# Patient Record
Sex: Male | Born: 1960 | Race: White | Hispanic: No | Marital: Married | Smoking: Current every day smoker
Health system: Southern US, Community
[De-identification: ages and names within clinical notes are randomized; demographics above are authoritative.]

## PROBLEM LIST (undated history)

## (undated) DIAGNOSIS — C4490 Unspecified malignant neoplasm of skin, unspecified: Secondary | ICD-10-CM

## (undated) DIAGNOSIS — M1990 Unspecified osteoarthritis, unspecified site: Secondary | ICD-10-CM

## (undated) DIAGNOSIS — J449 Chronic obstructive pulmonary disease, unspecified: Secondary | ICD-10-CM

## (undated) DIAGNOSIS — I739 Peripheral vascular disease, unspecified: Secondary | ICD-10-CM

## (undated) DIAGNOSIS — G4733 Obstructive sleep apnea (adult) (pediatric): Secondary | ICD-10-CM

## (undated) DIAGNOSIS — Z87442 Personal history of urinary calculi: Secondary | ICD-10-CM

## (undated) DIAGNOSIS — J189 Pneumonia, unspecified organism: Secondary | ICD-10-CM

## (undated) DIAGNOSIS — I10 Essential (primary) hypertension: Secondary | ICD-10-CM

## (undated) HISTORY — PX: COLONOSCOPY: SHX174

## (undated) HISTORY — PX: WISDOM TOOTH EXTRACTION: SHX21

## (undated) HISTORY — PX: VEIN SURGERY: SHX48

## (undated) HISTORY — PX: LUNG BIOPSY: SHX232

---

## 2019-01-31 DIAGNOSIS — I639 Cerebral infarction, unspecified: Secondary | ICD-10-CM

## 2019-01-31 HISTORY — DX: Cerebral infarction, unspecified: I63.9

## 2019-12-27 ENCOUNTER — Encounter (HOSPITAL_COMMUNITY): Payer: Self-pay

## 2019-12-27 NOTE — Progress Notes (Addendum)
COVID Vaccine Completed: No Date COVID Vaccine completed: N/A COVID vaccine manufacturer: N/A   surgical clearances  in chart  PCP - Etter Sjogren FNP Cardiologist - N/A Neurologist: Burna Sis FNP, [facility]  Chest x-ray - 10/25/19 in chart EKG -10/25/19 in chart Stress Test - greater than 2 years ECHO - request from [facility] Pacemaker/ICD device last checked:N/A  Sleep Study - Yes CPAP - Yes  Fasting Blood Sugar - N/A Checks Blood Sugar _N/A____ times a day  Blood Thinner Instructions: N/A Aspirin Instructions: Yes  Last Dose: 3-4 days prior to surgery  Anesthesia review: Stroke 2020, COPD  Patient denies shortness of breath, fever, cough and chest pain at PAT appointment   Patient verbalized understanding of instructions that were given to them at the PAT appointment. Patient was also instructed that they will need to review over the PAT instructions again at home before surgery.

## 2019-12-27 NOTE — Patient Instructions (Addendum)
DUE TO COVID-19 ONLY ONE VISITOR IS ALLOWED TO COME WITH YOU AND STAY IN THE WAITING ROOM ONLY DURING PRE OP AND PROCEDURE.    COVID SWAB TESTING MUST BE COMPLETED ON:  Today, Oct. 4, 2021 at 1:00 PM   [address] [place], [place] [address]  (Must self quarantine after testing. Follow instructions on handout.)       Your procedure is scheduled on:  Thursday, Oct. 7, 2021   Report to [facility] Main  Entrance    Report to admitting at 6:25 AM   Call this number if you have problems the morning of surgery [phone]   Do not eat food :After Midnight.   May have liquids until  5:45 AM  day of surgery  CLEAR LIQUID DIET  Foods Allowed                                                                     Foods Excluded  Water, Black Coffee and tea, regular and decaf                             liquids that you cannot  Plain Jell-O in any flavor  (No red)                                           see through such as: Fruit ices (not with fruit pulp)                                     milk, soups, orange juice              Iced Popsicles (No red)                                    All solid food                                   Apple juices Sports drinks like Gatorade (No red) Lightly seasoned clear broth or consume(fat free) Sugar, honey syrup  Sample Menu Breakfast                                Lunch                                     Supper Cranberry juice                    Beef broth                            Chicken broth Jell-O  Grape juice                           Apple juice Coffee or tea                        Jell-O                                      Popsicle                                                Coffee or tea                        Coffee or tea      Complete one Ensure drink the morning of surgery at  5:45AM     the day of surgery.   Oral Hygiene is also important to reduce your risk of infection.                                     Remember - BRUSH YOUR TEETH THE MORNING OF SURGERY WITH YOUR REGULAR TOOTHPASTE   Do NOT smoke after Midnight   Take these medicines the morning of surgery with A SIP OF WATER: None   Use Trelegy Inhaler the morning of surgery                               You may not have any metal on your body including  jewelry, and body piercings             Do not wear  lotions, powders, perfumes/cologne, or deodorant                         Men may shave face and neck.   Do not bring valuables to the hospital. [name].   Contacts, dentures or bridgework may not be worn into surgery.    Patients discharged the day of surgery will not be allowed to drive home.   Special Instructions: Bring a copy of your healthcare power of attorney and living will documents         the day of surgery if you haven't scanned them in before.              Please read over the following fact sheets you were given: IF YOU HAVE QUESTIONS ABOUT YOUR PRE OP INSTRUCTIONS PLEASE CALL [phone]   [facility] - Preparing for Surgery Before surgery, you can play an important role.  Because skin is not sterile, your skin needs to be as free of germs as possible.  You can reduce the number of germs on your skin by washing with CHG (chlorahexidine gluconate) soap before surgery.  CHG is an antiseptic cleaner which kills germs and bonds with the skin to continue killing germs even after washing. Please DO NOT use if you have an allergy to CHG or antibacterial  soaps.  If your skin becomes reddened/irritated stop using the CHG and inform your nurse when you arrive at Short Stay. Do not shave (including legs and underarms) for at least 48 hours prior to the first CHG shower.  You may shave your face/neck.  Please follow these instructions carefully:  1.  Shower with CHG Soap the night before surgery and the  morning of surgery.  2.  If you choose to wash  your hair, wash your hair first as usual with your normal  shampoo.  3.  After you shampoo, rinse your hair and body thoroughly to remove the shampoo.                             4.  Use CHG as you would any other liquid soap.  You can apply chg directly to the skin and wash.  Gently with a scrungie or clean washcloth.  5.  Apply the CHG Soap to your body ONLY FROM THE NECK DOWN.   Do   not use on face/ open                           Wound or open sores. Avoid contact with eyes, ears mouth and   genitals (private parts).                       Wash face,  Genitals (private parts) with your normal soap.             6.  Wash thoroughly, paying special attention to the area where your    surgery  will be performed.  7.  Thoroughly rinse your body with warm water from the neck down.  8.  DO NOT shower/wash with your normal soap after using and rinsing off the CHG Soap.                9.  Pat yourself dry with a clean towel.            10.  Wear clean pajamas.            11.  Place clean sheets on your bed the night of your first shower and do not  sleep with pets. Day of Surgery : Do not apply any lotions/deodorants the morning of surgery.  Please wear clean clothes to the hospital/surgery center.  FAILURE TO FOLLOW THESE INSTRUCTIONS MAY RESULT IN THE CANCELLATION OF YOUR SURGERY  PATIENT SIGNATURE_________________________________  NURSE SIGNATURE__________________________________  ________________________________________________________________________    Greg Morris  An incentive spirometer is a tool that can help keep your lungs clear and active. This tool measures how well you are filling your lungs with each breath. Taking long deep breaths may help reverse or decrease the chance of developing breathing (pulmonary) problems (especially infection) following:  A long period of time when you are unable to move or be active. BEFORE THE PROCEDURE   If the spirometer includes an  indicator to show your best effort, your nurse or respiratory therapist will set it to a desired goal.  If possible, sit up straight or lean slightly forward. Try not to slouch.  Hold the incentive spirometer in an upright position. INSTRUCTIONS FOR USE  1. Sit on the edge of your bed if possible, or sit up as far as you can in bed or on a chair. 2. Hold the incentive spirometer in an upright position.  3. Breathe out normally. 4. Place the mouthpiece in your mouth and seal your lips tightly around it. 5. Breathe in slowly and as deeply as possible, raising the piston or the ball toward the top of the column. 6. Hold your breath for 3-5 seconds or for as long as possible. Allow the piston or ball to fall to the bottom of the column. 7. Remove the mouthpiece from your mouth and breathe out normally. 8. Rest for a few seconds and repeat Steps 1 through 7 at least 10 times every 1-2 hours when you are awake. Take your time and take a few normal breaths between deep breaths. 9. The spirometer may include an indicator to show your best effort. Use the indicator as a goal to work toward during each repetition. 10. After each set of 10 deep breaths, practice coughing to be sure your lungs are clear. If you have an incision (the cut made at the time of surgery), support your incision when coughing by placing a pillow or rolled up towels firmly against it. Once you are able to get out of bed, walk around indoors and cough well. You may stop using the incentive spirometer when instructed by your caregiver.  RISKS AND COMPLICATIONS  Take your time so you do not get dizzy or light-headed.  If you are in pain, you may need to take or ask for pain medication before doing incentive spirometry. It is harder to take a deep breath if you are having pain. AFTER USE  Rest and breathe slowly and easily.  It can be helpful to keep track of a log of your progress. Your caregiver can provide you with a simple table  to help with this. If you are using the spirometer at home, follow these instructions: [place] IF:   You are having difficultly using the spirometer.  You have trouble using the spirometer as often as instructed.  Your pain medication is not giving enough relief while using the spirometer.  You develop fever of 100.5 F (38.1 C) or higher. SEEK IMMEDIATE MEDICAL CARE IF:   You cough up bloody sputum that had not been present before.  You develop fever of 102 F (38.9 C) or greater.  You develop worsening pain at or near the incision site. MAKE SURE YOU:   Understand these instructions.  Will watch your condition.  Will get help right away if you are not doing well or get worse. Document Released: 07/25/2006 Document Revised: 06/06/2011 Document Reviewed: 09/25/2006 Exit[employer] Patient Information 2014 [employer], [place].   ________________________________________________________________________  WHAT IS A BLOOD TRANSFUSION? Blood Transfusion Information  A transfusion is the replacement of blood or some of its parts. Blood is made up of multiple cells which provide different functions.  Red blood cells carry oxygen and are used for blood loss replacement.  White blood cells fight against infection.  Platelets control bleeding.  Plasma helps clot blood.  Other blood products are available for specialized needs, such as hemophilia or other clotting disorders. BEFORE THE TRANSFUSION  Who gives blood for transfusions?   Healthy volunteers who are fully evaluated to make sure their blood is safe. This is blood bank blood. Transfusion therapy is the safest it has ever been in the practice of medicine. Before blood is taken from a donor, a complete history is taken to make sure that person has no history of diseases nor engages in risky social behavior (examples are intravenous drug use or sexual activity with multiple partners). The  donor's travel history is screened  to minimize risk of transmitting infections, such as malaria. The donated blood is tested for signs of infectious diseases, such as HIV and hepatitis. The blood is then tested to be sure it is compatible with you in order to minimize the chance of a transfusion reaction. If you or a relative donates blood, this is often done in anticipation of surgery and is not appropriate for emergency situations. It takes many days to process the donated blood. RISKS AND COMPLICATIONS Although transfusion therapy is very safe and saves many lives, the main dangers of transfusion include:   Getting an infectious disease.  Developing a transfusion reaction. This is an allergic reaction to something in the blood you were given. Every precaution is taken to prevent this. The decision to have a blood transfusion has been considered carefully by your caregiver before blood is given. Blood is not given unless the benefits outweigh the risks. AFTER THE TRANSFUSION  Right after receiving a blood transfusion, you will usually feel much better and more energetic. This is especially true if your red blood cells have gotten low (anemic). The transfusion raises the level of the red blood cells which carry oxygen, and this usually causes an energy increase.  The nurse administering the transfusion will monitor you carefully for complications. HOME CARE INSTRUCTIONS  No special instructions are needed after a transfusion. You may find your energy is better. Speak with your caregiver about any limitations on activity for underlying diseases you may have. SEEK MEDICAL CARE IF:   Your condition is not improving after your transfusion.  You develop redness or irritation at the intravenous (IV) site. SEEK IMMEDIATE MEDICAL CARE IF:  Any of the following symptoms occur over the next 12 hours:  Shaking chills.  You have a temperature by mouth above 102 F (38.9 C), not controlled by medicine.  Chest, back, or muscle  pain.  People around you feel you are not acting correctly or are confused.  Shortness of breath or difficulty breathing.  Dizziness and fainting.  You get a rash or develop hives.  You have a decrease in urine output.  Your urine turns a dark color or changes to pink, red, or brown. Any of the following symptoms occur over the next 10 days:  You have a temperature by mouth above 102 F (38.9 C), not controlled by medicine.  Shortness of breath.  Weakness after normal activity.  The white part of the eye turns yellow (jaundice).  You have a decrease in the amount of urine or are urinating less often.  Your urine turns a dark color or changes to pink, red, or brown. Document Released: 03/11/2000 Document Revised: 06/06/2011 Document Reviewed: 10/29/2007 [facility] Patient Information 2014 [place], [place].  _______________________________________________________________________

## 2019-12-30 ENCOUNTER — Encounter (HOSPITAL_COMMUNITY)
Admission: RE | Admit: 2019-12-30 | Discharge: 2019-12-30 | Disposition: A | Discharge status: Home / Self Care | Payer: BC Managed Care – PPO | Source: Ambulatory Visit | Attending: Orthopedic Surgery | Admitting: Orthopedic Surgery

## 2019-12-30 ENCOUNTER — Other Ambulatory Visit (HOSPITAL_COMMUNITY)
Admission: RE | Admit: 2019-12-30 | Discharge: 2019-12-30 | Disposition: A | Discharge status: Home / Self Care | Payer: BC Managed Care – PPO | Source: Ambulatory Visit | Attending: Orthopedic Surgery | Admitting: Orthopedic Surgery

## 2019-12-30 ENCOUNTER — Encounter (HOSPITAL_COMMUNITY): Payer: Self-pay

## 2019-12-30 ENCOUNTER — Other Ambulatory Visit: Payer: Self-pay

## 2019-12-30 DIAGNOSIS — Z01812 Encounter for preprocedural laboratory examination: Secondary | ICD-10-CM | POA: Insufficient documentation

## 2019-12-30 DIAGNOSIS — Z20822 Contact with and (suspected) exposure to covid-19: Secondary | ICD-10-CM | POA: Insufficient documentation

## 2019-12-30 HISTORY — DX: Obstructive sleep apnea (adult) (pediatric): G47.33

## 2019-12-30 HISTORY — DX: Essential (primary) hypertension: I10

## 2019-12-30 HISTORY — DX: Unspecified malignant neoplasm of skin, unspecified: C44.90

## 2019-12-30 HISTORY — DX: Personal history of urinary calculi: Z87.442

## 2019-12-30 HISTORY — DX: Pneumonia, unspecified organism: J18.9

## 2019-12-30 HISTORY — DX: Chronic obstructive pulmonary disease, unspecified: J44.9

## 2019-12-30 HISTORY — DX: Unspecified osteoarthritis, unspecified site: M19.90

## 2019-12-30 HISTORY — DX: Peripheral vascular disease, unspecified: I73.9

## 2019-12-30 LAB — CBC
HCT: 45 % (ref 39.0–52.0)
Hemoglobin: 15 g/dL (ref 13.0–17.0)
MCH: 32.3 pg (ref 26.0–34.0)
MCHC: 33.3 g/dL (ref 30.0–36.0)
MCV: 97 fL (ref 80.0–100.0)
Platelets: 308 10*3/uL (ref 150–400)
RBC: 4.64 MIL/uL (ref 4.22–5.81)
RDW: 12.4 % (ref 11.5–15.5)
WBC: 8.3 10*3/uL (ref 4.0–10.5)
nRBC: 0 % (ref 0.0–0.2)

## 2019-12-30 LAB — BASIC METABOLIC PANEL
Anion gap: 11 (ref 5–15)
BUN: 11 mg/dL (ref 6–20)
CO2: 23 mmol/L (ref 22–32)
Calcium: 8.9 mg/dL (ref 8.9–10.3)
Chloride: 106 mmol/L (ref 98–111)
Creatinine, Ser: 1.05 mg/dL (ref 0.61–1.24)
GFR calc Af Amer: >60 mL/min (ref >60)
GFR calc non Af Amer: >60 mL/min (ref >60)
Glucose, Bld: 99 mg/dL (ref 70–99)
Potassium: 3.8 mmol/L (ref 3.5–5.1)
Sodium: 140 mmol/L (ref 135–145)

## 2019-12-30 LAB — SURGICAL PCR SCREEN
MRSA, PCR: NEGATIVE
Staphylococcus aureus: NEGATIVE

## 2019-12-30 LAB — SARS CORONAVIRUS 2 (TAT 6-24 HRS): SARS Coronavirus 2: NEGATIVE

## 2020-01-02 ENCOUNTER — Encounter (HOSPITAL_COMMUNITY): Payer: Self-pay | Admitting: Orthopedic Surgery

## 2020-01-02 ENCOUNTER — Encounter (HOSPITAL_COMMUNITY)
Admission: RE | Disposition: A | Discharge status: Home / Self Care | Payer: Self-pay | Source: Home / Self Care | Attending: Orthopedic Surgery

## 2020-01-02 ENCOUNTER — Ambulatory Visit (HOSPITAL_COMMUNITY): Payer: BC Managed Care – PPO

## 2020-01-02 ENCOUNTER — Ambulatory Visit (HOSPITAL_COMMUNITY): Payer: BC Managed Care – PPO | Admitting: Physician Assistant

## 2020-01-02 ENCOUNTER — Ambulatory Visit (HOSPITAL_COMMUNITY)
Admission: RE | Admit: 2020-01-02 | Discharge: 2020-01-02 | Disposition: A | Discharge status: Home / Self Care | Payer: BC Managed Care – PPO | Attending: Orthopedic Surgery | Admitting: Orthopedic Surgery

## 2020-01-02 ENCOUNTER — Ambulatory Visit (HOSPITAL_COMMUNITY): Payer: BC Managed Care – PPO | Admitting: Certified Registered Nurse Anesthetist

## 2020-01-02 DIAGNOSIS — M1611 Unilateral primary osteoarthritis, right hip: Secondary | ICD-10-CM | POA: Insufficient documentation

## 2020-01-02 DIAGNOSIS — G4730 Sleep apnea, unspecified: Secondary | ICD-10-CM | POA: Diagnosis not present

## 2020-01-02 DIAGNOSIS — I739 Peripheral vascular disease, unspecified: Secondary | ICD-10-CM | POA: Insufficient documentation

## 2020-01-02 DIAGNOSIS — F17200 Nicotine dependence, unspecified, uncomplicated: Secondary | ICD-10-CM | POA: Insufficient documentation

## 2020-01-02 DIAGNOSIS — Z85828 Personal history of other malignant neoplasm of skin: Secondary | ICD-10-CM | POA: Diagnosis not present

## 2020-01-02 DIAGNOSIS — I69354 Hemiplegia and hemiparesis following cerebral infarction affecting left non-dominant side: Secondary | ICD-10-CM | POA: Diagnosis not present

## 2020-01-02 DIAGNOSIS — M879 Osteonecrosis, unspecified: Secondary | ICD-10-CM | POA: Insufficient documentation

## 2020-01-02 DIAGNOSIS — J449 Chronic obstructive pulmonary disease, unspecified: Secondary | ICD-10-CM | POA: Diagnosis not present

## 2020-01-02 DIAGNOSIS — Z87442 Personal history of urinary calculi: Secondary | ICD-10-CM | POA: Insufficient documentation

## 2020-01-02 DIAGNOSIS — F17210 Nicotine dependence, cigarettes, uncomplicated: Secondary | ICD-10-CM | POA: Diagnosis not present

## 2020-01-02 DIAGNOSIS — S72041A Displaced fracture of base of neck of right femur, initial encounter for closed fracture: Secondary | ICD-10-CM

## 2020-01-02 DIAGNOSIS — G4733 Obstructive sleep apnea (adult) (pediatric): Secondary | ICD-10-CM | POA: Insufficient documentation

## 2020-01-02 DIAGNOSIS — Z96649 Presence of unspecified artificial hip joint: Secondary | ICD-10-CM

## 2020-01-02 DIAGNOSIS — I10 Essential (primary) hypertension: Secondary | ICD-10-CM | POA: Diagnosis not present

## 2020-01-02 HISTORY — PX: TOTAL HIP ARTHROPLASTY: SHX124

## 2020-01-02 LAB — TYPE AND SCREEN
ABO/RH(D): O POS
Antibody Screen: NEGATIVE

## 2020-01-02 LAB — ABO/RH: ABO/RH(D): O POS

## 2020-01-02 SURGERY — ARTHROPLASTY, HIP, TOTAL, ANTERIOR APPROACH
Anesthesia: Spinal | Site: Hip | Laterality: Right

## 2020-01-02 MED ORDER — CHLORHEXIDINE GLUCONATE 0.12 % MT SOLN
15.0000 mL | Freq: Once | OROMUCOSAL | Status: AC
  Administered 2020-01-02: 15 mL via OROMUCOSAL

## 2020-01-02 MED ORDER — CEFAZOLIN SODIUM-DEXTROSE 2-4 GM/100ML-% IV SOLN
2.0000 g | INTRAVENOUS | Status: AC
  Administered 2020-01-02: 2 g via INTRAVENOUS

## 2020-01-02 MED ORDER — CEFAZOLIN SODIUM-DEXTROSE 2-4 GM/100ML-% IV SOLN
2.0000 g | Freq: Four times a day (QID) | INTRAVENOUS | Status: DC
  Administered 2020-01-02: 2 g via INTRAVENOUS

## 2020-01-02 MED ORDER — CEFAZOLIN SODIUM-DEXTROSE 2-4 GM/100ML-% IV SOLN
INTRAVENOUS | Status: AC
  Filled 2020-01-02: qty 100

## 2020-01-02 MED ORDER — ONDANSETRON HCL 4 MG/2ML IJ SOLN
INTRAMUSCULAR | Status: AC
  Filled 2020-01-02: qty 2

## 2020-01-02 MED ORDER — PROPOFOL 500 MG/50ML IV EMUL
INTRAVENOUS | Status: AC
  Filled 2020-01-02: qty 50

## 2020-01-02 MED ORDER — OXYCODONE HCL 5 MG PO TABS
5.0000 mg | ORAL_TABLET | Freq: Once | ORAL | Status: AC | PRN
  Administered 2020-01-02: 5 mg via ORAL

## 2020-01-02 MED ORDER — FENTANYL CITRATE (PF) 100 MCG/2ML IJ SOLN: 25.0000 ug | INTRAMUSCULAR | Status: DC | PRN

## 2020-01-02 MED ORDER — LACTATED RINGERS IV BOLUS
250.0000 mL | Freq: Once | INTRAVENOUS | Status: AC
  Administered 2020-01-02: 250 mL via INTRAVENOUS

## 2020-01-02 MED ORDER — TRANEXAMIC ACID-NACL 1000-0.7 MG/100ML-% IV SOLN
1000.0000 mg | INTRAVENOUS | Status: AC
  Administered 2020-01-02: 1000 mg via INTRAVENOUS

## 2020-01-02 MED ORDER — TRANEXAMIC ACID-NACL 1000-0.7 MG/100ML-% IV SOLN
1000.0000 mg | Freq: Once | INTRAVENOUS | Status: AC
  Administered 2020-01-02: 1000 mg via INTRAVENOUS

## 2020-01-02 MED ORDER — SODIUM CHLORIDE 0.9 % IR SOLN
Status: DC | PRN
  Administered 2020-01-02: 1000 mL

## 2020-01-02 MED ORDER — DEXAMETHASONE SODIUM PHOSPHATE 10 MG/ML IJ SOLN
INTRAMUSCULAR | Status: AC
  Filled 2020-01-02: qty 1

## 2020-01-02 MED ORDER — ONDANSETRON HCL 4 MG/2ML IJ SOLN
INTRAMUSCULAR | Status: DC | PRN
  Administered 2020-01-02: 4 mg via INTRAVENOUS

## 2020-01-02 MED ORDER — PROPOFOL 10 MG/ML IV BOLUS
INTRAVENOUS | Status: AC
  Filled 2020-01-02: qty 20

## 2020-01-02 MED ORDER — DEXAMETHASONE SODIUM PHOSPHATE 10 MG/ML IJ SOLN
10.0000 mg | Freq: Once | INTRAMUSCULAR | Status: AC
  Administered 2020-01-02: 10 mg via INTRAVENOUS

## 2020-01-02 MED ORDER — LIDOCAINE 2% (20 MG/ML) 5 ML SYRINGE
INTRAMUSCULAR | Status: AC
  Filled 2020-01-02: qty 5

## 2020-01-02 MED ORDER — ORAL CARE MOUTH RINSE: 15.0000 mL | Freq: Once | OROMUCOSAL | Status: AC

## 2020-01-02 MED ORDER — OXYCODONE HCL 5 MG/5ML PO SOLN: 5.0000 mg | Freq: Once | ORAL | Status: AC | PRN

## 2020-01-02 MED ORDER — PROPOFOL 10 MG/ML IV BOLUS
INTRAVENOUS | Status: DC | PRN
  Administered 2020-01-02 (×2): 30 mg via INTRAVENOUS
  Administered 2020-01-02: 20 mg via INTRAVENOUS
  Administered 2020-01-02: 30 mg via INTRAVENOUS
  Administered 2020-01-02 (×4): 20 mg via INTRAVENOUS
  Administered 2020-01-02: 40 mg via INTRAVENOUS
  Administered 2020-01-02: 20 mg via INTRAVENOUS

## 2020-01-02 MED ORDER — LACTATED RINGERS IV SOLN: INTRAVENOUS | Status: DC

## 2020-01-02 MED ORDER — PHENYLEPHRINE HCL-NACL 10-0.9 MG/250ML-% IV SOLN
INTRAVENOUS | Status: DC | PRN
  Administered 2020-01-02: 20 ug/min via INTRAVENOUS

## 2020-01-02 MED ORDER — PHENYLEPHRINE HCL (PRESSORS) 10 MG/ML IV SOLN
INTRAVENOUS | Status: AC
  Filled 2020-01-02: qty 1

## 2020-01-02 MED ORDER — MIDAZOLAM HCL 5 MG/5ML IJ SOLN
INTRAMUSCULAR | Status: DC | PRN
  Administered 2020-01-02: 2 mg via INTRAVENOUS

## 2020-01-02 MED ORDER — LIDOCAINE 2% (20 MG/ML) 5 ML SYRINGE
INTRAMUSCULAR | Status: DC | PRN
  Administered 2020-01-02: 50 mg via INTRAVENOUS

## 2020-01-02 MED ORDER — STERILE WATER FOR IRRIGATION IR SOLN
Status: DC | PRN
Start: 2020-01-02 — End: 2020-01-02
  Administered 2020-01-02: 2000 mL

## 2020-01-02 MED ORDER — LACTATED RINGERS IV BOLUS
500.0000 mL | Freq: Once | INTRAVENOUS | Status: AC
  Administered 2020-01-02: 500 mL via INTRAVENOUS

## 2020-01-02 MED ORDER — TRANEXAMIC ACID-NACL 1000-0.7 MG/100ML-% IV SOLN
INTRAVENOUS | Status: AC
  Filled 2020-01-02: qty 100

## 2020-01-02 MED ORDER — FENTANYL CITRATE (PF) 100 MCG/2ML IJ SOLN
INTRAMUSCULAR | Status: AC
  Filled 2020-01-02: qty 2

## 2020-01-02 MED ORDER — FENTANYL CITRATE (PF) 100 MCG/2ML IJ SOLN
INTRAMUSCULAR | Status: DC | PRN
Start: 2020-01-02 — End: 2020-01-02

## 2020-01-02 MED ORDER — OXYCODONE HCL 5 MG PO TABS
5.0000 mg | ORAL_TABLET | Freq: Once | ORAL | Status: AC
  Administered 2020-01-02: 5 mg via ORAL

## 2020-01-02 MED ORDER — PROPOFOL 500 MG/50ML IV EMUL
INTRAVENOUS | Status: DC | PRN
  Administered 2020-01-02: 100 ug/kg/min via INTRAVENOUS

## 2020-01-02 MED ORDER — OXYCODONE HCL 5 MG PO TABS
ORAL_TABLET | ORAL | Status: AC
  Filled 2020-01-02: qty 1

## 2020-01-02 MED ORDER — PROMETHAZINE HCL 25 MG/ML IJ SOLN: 6.2500 mg | INTRAMUSCULAR | Status: DC | PRN

## 2020-01-02 MED ORDER — BUPIVACAINE IN DEXTROSE 0.75-8.25 % IT SOLN
INTRATHECAL | Status: DC | PRN
  Administered 2020-01-02: 1.8 mL via INTRATHECAL

## 2020-01-02 MED ORDER — MIDAZOLAM HCL 2 MG/2ML IJ SOLN
INTRAMUSCULAR | Status: AC
  Filled 2020-01-02: qty 2

## 2020-01-02 SURGICAL SUPPLY — 47 items
ADH SKN CLS APL DERMABOND .7 (GAUZE/BANDAGES/DRESSINGS) ×1
BAG DECANTER FOR FLEXI CONT (MISCELLANEOUS) IMPLANT
BAG SPEC THK2 15X12 ZIP CLS (MISCELLANEOUS)
BAG ZIPLOCK 12X15 (MISCELLANEOUS) IMPLANT
BLADE SAG 18X100X1.27 (BLADE) ×3 IMPLANT
BLADE SURG SZ10 CARB STEEL (BLADE) ×6 IMPLANT
COVER PERINEAL POST (MISCELLANEOUS) ×3 IMPLANT
COVER SURGICAL LIGHT HANDLE (MISCELLANEOUS) ×3 IMPLANT
COVER WAND RF STERILE (DRAPES) IMPLANT
CUP ACETBLR 54 OD PINNACLE (Hips) ×3 IMPLANT
DERMABOND ADVANCED (GAUZE/BANDAGES/DRESSINGS) ×2
DERMABOND ADVANCED .7 DNX12 (GAUZE/BANDAGES/DRESSINGS) ×1 IMPLANT
DRAPE STERI IOBAN 125X83 (DRAPES) ×3 IMPLANT
DRAPE U-SHAPE 47X51 STRL (DRAPES) ×6 IMPLANT
DRESSING AQUACEL AG SP 3.5X10 (GAUZE/BANDAGES/DRESSINGS) ×1 IMPLANT
DRSG AQUACEL AG ADV 3.5X10 (GAUZE/BANDAGES/DRESSINGS) ×3 IMPLANT
DRSG AQUACEL AG SP 3.5X10 (GAUZE/BANDAGES/DRESSINGS) ×3
DURAPREP 26ML APPLICATOR (WOUND CARE) ×3 IMPLANT
ELECT REM PT RETURN 15FT ADLT (MISCELLANEOUS) ×3 IMPLANT
ELIMINATOR HOLE APEX DEPUY (Hips) ×3 IMPLANT
GLOVE BIO SURGEON STRL SZ 6 (GLOVE) ×6 IMPLANT
GLOVE BIOGEL PI IND STRL 6.5 (GLOVE) ×1 IMPLANT
GLOVE BIOGEL PI IND STRL 7.5 (GLOVE) ×1 IMPLANT
GLOVE BIOGEL PI IND STRL 8.5 (GLOVE) ×1 IMPLANT
GLOVE BIOGEL PI INDICATOR 6.5 (GLOVE) ×2
GLOVE BIOGEL PI INDICATOR 7.5 (GLOVE) ×2
GLOVE BIOGEL PI INDICATOR 8.5 (GLOVE) ×2
GLOVE ECLIPSE 8.0 STRL XLNG CF (GLOVE) ×6 IMPLANT
GLOVE ORTHO TXT STRL SZ7.5 (GLOVE) ×6 IMPLANT
GOWN STRL REUS W/TWL LRG LVL3 (GOWN DISPOSABLE) ×6 IMPLANT
GOWN STRL REUS W/TWL XL LVL3 (GOWN DISPOSABLE) ×3 IMPLANT
HEAD CERAMIC 36 PLUS5 (Hips) ×3 IMPLANT
HOLDER FOLEY CATH W/STRAP (MISCELLANEOUS) ×3 IMPLANT
KIT TURNOVER KIT A (KITS) IMPLANT
LINER NEUTRAL 54X36MM PLUS 4 (Hips) ×3 IMPLANT
PACK ANTERIOR HIP CUSTOM (KITS) ×3 IMPLANT
PENCIL SMOKE EVACUATOR (MISCELLANEOUS) IMPLANT
SCREW 6.5MMX30MM (Screw) ×3 IMPLANT
STEM FEM ACTIS HIGH SZ8 (Stem) ×3 IMPLANT
SUT MNCRL AB 4-0 PS2 18 (SUTURE) ×3 IMPLANT
SUT STRATAFIX 0 PDS 27 VIOLET (SUTURE) ×3
SUT VIC AB 1 CT1 36 (SUTURE) ×9 IMPLANT
SUT VIC AB 2-0 CT1 27 (SUTURE) ×6
SUT VIC AB 2-0 CT1 TAPERPNT 27 (SUTURE) ×2 IMPLANT
SUTURE STRATFX 0 PDS 27 VIOLET (SUTURE) ×1 IMPLANT
TRAY FOLEY MTR SLVR 16FR STAT (SET/KITS/TRAYS/PACK) IMPLANT
WATER STERILE IRR 1000ML POUR (IV SOLUTION) ×3 IMPLANT

## 2020-01-02 NOTE — Discharge Instructions (Signed)

## 2020-01-02 NOTE — H&P (Signed)
TOTAL HIP ADMISSION H&P  Patient is admitted for right total hip arthroplasty.  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 male, has a history of pain and functional disability in the right hip(s) due to arthritis and AVN and patient has failed non-surgical conservative treatments for greater than 12 weeks to include NSAID's and/or analgesics and activity modification.  Onset of symptoms was gradual starting 7-8 years ago with gradually worsening course since that time.The patient noted no past surgery on the right hip(s).  Patient currently rates pain in the right hip at 10 out of 10 with activity. Patient has night pain, worsening of pain with activity and weight bearing, trendelenberg gait, pain that interfers with activities of daily living and pain with passive range of motion. Patient has evidence of periarticular osteophytes, joint space narrowing and AVN by imaging studies. This condition presents safety issues increasing the risk of falls.   There is no current active infection.  Risks, benefits and expectations were discussed with the patient.  Risks including but not limited to the risk of anesthesia, blood clots, nerve damage, blood vessel damage, failure of the prosthesis, infection and up to and including death.  Patient understand the risks, benefits and expectations and wishes to proceed with surgery.   PCP: Etter Sjogren, FNP  D/C Plans:       Home (SD)  Post-op Meds:        Rx given for ASA, Robaxin, Norco, Iron, Colace and MiraLax  Tranexamic Acid:      To be given - IV   Decadron:      Is to be given  FYI:      ASA  Norco  DME:   Pt already has equipment   PT:   [facility]  Pharmacy: [facility], [place]    Past Medical History:  Diagnosis Date  . Arthritis   . COPD (chronic obstructive pulmonary disease) ([place])   . History of kidney stones   . Hypertension   . OSA on CPAP   . Pneumonia    history of 15 years ago  .  PVD (peripheral vascular disease) ([facility])    leaking valves in legs  . Skin cancer   . Stroke ([place]) 01/31/2019   left side weakness and tingling    Past Surgical History:  Procedure Laterality Date  . COLONOSCOPY    . LUNG BIOPSY    . VEIN SURGERY Bilateral   . WISDOM TOOTH EXTRACTION      Current Facility-Administered Medications  Medication Dose Route Frequency Provider Last Rate Last Admin  . ceFAZolin (ANCEF) 2-4 GM/100ML-% IVPB           . ceFAZolin (ANCEF) IVPB 2g/100 mL premix  2 g Intravenous On Call to OR Tenzin Pavon, Rodman Key, PA-C      . dexamethasone (DECADRON) injection 10 mg  10 mg Intravenous Once Kalie Cabral, PA-C      . lactated ringers infusion   Intravenous Continuous Lyn Hollingshead, MD 10 mL/hr at 01/02/20 0655 New Bag at 01/02/20 8469  . tranexamic acid (CYKLOKAPRON) 1000MG /138mL IVPB           . tranexamic acid (CYKLOKAPRON) IVPB 1,000 mg  1,000 mg Intravenous To OR Avan Gullett, PA-C       No Known Allergies  Social History   Tobacco Use  . Smoking status: Current Every Day Smoker    Packs/day: 1.00    Years: 39.00    Pack years:  39.00    Types: Cigarettes  . Smokeless tobacco: Never Used  Substance Use Topics  . Alcohol use: Yes    Comment: rare    History reviewed. No pertinent family history.   Review of Systems  Constitutional: Negative.   HENT: Negative.   Eyes: Negative.   Respiratory: Negative.   Cardiovascular: Negative.   Gastrointestinal: Negative.   Genitourinary: Negative.   Musculoskeletal: Positive for joint pain.  Skin: Negative.   Neurological: Negative.   Endo/Heme/Allergies: Negative.   Psychiatric/Behavioral: Negative.       Objective:  Physical Exam Constitutional:      Appearance: He is well-developed.  HENT:     Head: Normocephalic.  Eyes:     Pupils: Pupils are equal, round, and reactive to light.  Neck:     Thyroid: No thyromegaly.     Vascular: No JVD.     Trachea: No tracheal deviation.   Cardiovascular:     Rate and Rhythm: Normal rate and regular rhythm.  Pulmonary:     Effort: Pulmonary effort is normal. No respiratory distress.     Breath sounds: Normal breath sounds. No wheezing.  Abdominal:     Palpations: Abdomen is soft.     Tenderness: There is no abdominal tenderness. There is no guarding.  Musculoskeletal:     Cervical back: Neck supple.     Right hip: Tenderness and bony tenderness present. No deformity. Decreased range of motion. Decreased strength.  Lymphadenopathy:     Cervical: No cervical adenopathy.  Skin:    General: Skin is warm and dry.  Neurological:     Mental Status: He is alert and oriented to person, place, and time.     Vital signs in last 24 hours: Temp:  [98.8 F (37.1 C)] 98.8 F (37.1 C) (10/07 0644) Pulse Rate:  [81] 81 (10/07 0644) Resp:  [18] 18 (10/07 0644) BP: (169)/(89) 169/89 (10/07 0644) SpO2:  [97 %] 97 % (10/07 0644) Weight:  [425.9 kg] 113.2 kg (10/07 0626)  Labs:   Estimated body mass index is 37.95 kg/m as calculated from the following:   Height as of this encounter: 5\' 8"  (1.727 m).   Weight as of this encounter: 113.2 kg.   Imaging Review Plain radiographs demonstrate severe degenerative joint disease / AVN of the right hip(s). The bone quality appears to be good for age and reported activity level.      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 The treatment options including medical management, injection therapy, arthroscopy and arthroplasty were discussed at length. The risks and benefits of total hip arthroplasty were presented and reviewed. The risks due to aseptic loosening, infection, stiffness, dislocation/subluxation,  thromboembolic complications and other imponderables  were discussed.  The patient acknowledged the explanation, agreed to proceed with the plan and consent was signed. Patient is being admitted for treatment for surgery, pain control, PT, OT, prophylactic antibiotics, VTE prophylaxis, progressive ambulation and ADL's and discharge planning.The patient is planning to be discharged home.    [place] Lebeau   PA-C  01/02/2020, 7:02 AM

## 2020-01-02 NOTE — Anesthesia Preprocedure Evaluation (Addendum)
Anesthesia Evaluation  Patient identified by MRN, date of birth, ID band Patient awake    Reviewed: Allergy & Precautions, NPO status , Patient's Chart, lab work & pertinent test results  Airway Mallampati: III  TM Distance: >3 FB Neck ROM: Full    Dental no notable dental hx.    Pulmonary sleep apnea and Continuous Positive Airway Pressure Ventilation , COPD, Current Smoker,    Pulmonary exam normal breath sounds clear to auscultation       Cardiovascular hypertension, + Peripheral Vascular Disease  Normal cardiovascular exam Rhythm:Regular Rate:Normal  Reviewed ekg    Neuro/Psych CVA (left sided weakness), Residual Symptoms    GI/Hepatic negative GI ROS, Neg liver ROS,   Endo/Other  negative endocrine ROSBMI 38  Renal/GU negative Renal ROS  negative genitourinary   Musculoskeletal  (+) Arthritis ,   Abdominal   Peds  Hematology negative hematology ROS (+)   Anesthesia Other Findings   Reproductive/Obstetrics                            Anesthesia Physical Anesthesia Plan  ASA: III  Anesthesia Plan: Spinal   Post-op Pain Management:    Induction: Intravenous  PONV Risk Score and Plan: TIVA, Treatment may vary due to age or medical condition, Propofol infusion, Midazolam and Ondansetron  Airway Management Planned: Natural Airway and Simple Face Mask  Additional Equipment:   Intra-op Plan:   Post-operative Plan:   Informed Consent: I have reviewed the patients History and Physical, chart, labs and discussed the procedure including the risks, benefits and alternatives for the proposed anesthesia with the patient or authorized representative who has indicated his/her understanding and acceptance.     Dental advisory given  Plan Discussed with: Anesthesiologist and CRNA  Anesthesia Plan Comments:         Anesthesia Quick Evaluation

## 2020-01-02 NOTE — Op Note (Signed)
NAME:  Greg Morris                ACCOUNT NO.: [id number]      MEDICAL RECORD NO.: [id number]      FACILITY:  [facility]      PHYSICIAN:  Greg Morris  DATE OF BIRTH:  1961/02/08     DATE OF PROCEDURE:  01/02/2020                                 OPERATIVE REPORT         PREOPERATIVE DIAGNOSIS: Right  hip osteoarthritis.      POSTOPERATIVE DIAGNOSIS:  Right hip osteoarthritis.      PROCEDURE:  Right total hip replacement through an anterior approach   utilizing DePuy THR system, component size 54 mm pinnacle cup, a size 36+4 neutral   Altrex liner, a size 8 Hi Actis stem with a 36+5 delta ceramic   ball.      SURGEON:  Greg Cassis. Alvan Morris, M.D.      ASSISTANT:  Greg Citron, PA-C     ANESTHESIA:  Spinal.      SPECIMENS:  None.      COMPLICATIONS:  None.      BLOOD LOSS:  400 cc     DRAINS:  None.      INDICATION OF THE PROCEDURE:  Greg Morris is a 59 y.o. male who had   presented to office for evaluation of right hip pain.  Radiographs revealed   progressive degenerative changes with bone-on-bone   articulation of the  hip joint, including subchondral cystic changes and osteophytes.  The patient had painful limited range of   motion significantly affecting their overall quality of life and function.  The patient was failing to    respond to conservative measures including medications and/or injections and activity modification and at this point was ready   to proceed with more definitive measures.  Consent was obtained for   benefit of pain relief.  Specific risks of infection, DVT, component   failure, dislocation, neurovascular injury, and need for revision surgery were reviewed in the office as well discussion of   the anterior versus posterior approach were reviewed.     PROCEDURE IN DETAIL:  The patient was brought to operative theater.   Once adequate anesthesia, preoperative antibiotics, 2 gm of Ancef, 1 gm of Tranexamic Acid, and 10 mg of  Decadron were administered, the patient was positioned supine on the [employer] table.  Once the patient was safely positioned with adequate padding of boney prominences we predraped out the hip, and used fluoroscopy to confirm orientation of the pelvis.      The right hip was then prepped and draped from proximal iliac crest to   mid thigh with a shower curtain technique.      Time-out was performed identifying the patient, planned procedure, and the appropriate extremity.     An incision was then made 2 cm lateral to the   anterior superior iliac spine extending over the orientation of the   tensor fascia lata muscle and sharp dissection was carried down to the   fascia of the muscle.      The fascia was then incised.  The muscle belly was identified and swept   laterally and retractor placed along the superior neck.  Following   cauterization of the circumflex vessels and removing some pericapsular  fat, a second cobra retractor was placed on the inferior neck.  A T-capsulotomy was made along the line of the   superior neck to the trochanteric fossa, then extended proximally and   distally.  Tag sutures were placed and the retractors were then placed   intracapsular.  We then identified the trochanteric fossa and   orientation of my neck cut and then made a neck osteotomy with the femur on traction.  The femoral   head was removed without difficulty or complication.  Traction was let   off and retractors were placed posterior and anterior around the   acetabulum.      The labrum and foveal tissue were debrided.  I began reaming with a 46 mm   reamer and reamed up to 53 mm reamer with good bony bed preparation and a 54 mm  cup was chosen.  The final 54 mm Pinnacle cup was then impacted under fluoroscopy to confirm the depth of penetration and orientation with respect to   Abduction and forward flexion.  A screw was placed into the ilium followed by the hole eliminator.  The final   36+4  neutral Altrex liner was impacted with good visualized rim fit.  The cup was positioned anatomically within the acetabular portion of the pelvis.      At this point, the femur was rolled to 100 degrees.  Further capsule was   released off the inferior aspect of the femoral neck.  I then   released the superior capsule proximally.  With the leg in a neutral position the hook was placed laterally   along the femur under the vastus lateralis origin and elevated manually and then held in position using the hook attachment on the bed.  The leg was then extended and adducted with the leg rolled to 100   degrees of external rotation.  Retractors were placed along the medial calcar and posteriorly over the greater trochanter.  Once the proximal femur was fully   exposed, I used a box osteotome to set orientation.  I then began   broaching with the starting chili pepper broach and passed this by hand and then broached up to 8.  With the 8 broach in place I chose a high offset neck and did several trial reductions.  The offset was appropriate, leg lengths   appeared to be equal best matched with the +5 head ball trial confirmed radiographically.   Given these findings, I went ahead and dislocated the hip, repositioned all   retractors and positioned the right hip in the extended and abducted position.  The final 8 Hi Actis stem was   chosen and it was impacted down to the level of neck cut.  Based on this   and the trial reductions, a final 36+5 delta ceramic ball was chosen and   impacted onto a clean and dry trunnion, and the hip was reduced.  The   hip had been irrigated throughout the case again at this point.  I did   reapproximate the superior capsular leaflet to the anterior leaflet   using #1 Vicryl.  The fascia of the   tensor fascia lata muscle was then reapproximated using #1 Vicryl and #0 Stratafix sutures.  The   remaining wound was closed with 2-0 Vicryl and running 4-0 Monocryl.   The hip  was cleaned, dried, and dressed sterilely using Dermabond and   Aquacel dressing.  The patient was then brought   to recovery room in  stable condition tolerating the procedure well.    Greg Citron, PA-C was present for the entirety of the case involved from   preoperative positioning, perioperative retractor management, general   facilitation of the case, as well as primary wound closure as assistant.            Greg Morris, M.D.        01/02/2020 8:47 AM

## 2020-01-02 NOTE — Interval H&P Note (Signed)
History and Physical Interval Note:  01/02/2020 7:07 AM  Greg Morris  has presented today for surgery, with the diagnosis of Right hip osteoarthritis.  The various methods of treatment have been discussed with the patient and family. After consideration of risks, benefits and other options for treatment, the patient has consented to  Procedure(s) with comments: TOTAL HIP ARTHROPLASTY ANTERIOR APPROACH (Right) - 70 mins as a surgical intervention.  The patient's history has been reviewed, patient examined, no change in status, stable for surgery.  I have reviewed the patient's chart and labs.  Questions were answered to the patient's satisfaction.     Greg Pole

## 2020-01-02 NOTE — Transfer of Care (Signed)
Immediate Anesthesia Transfer of Care Note  Patient: Greg Morris  Procedure(s) Performed: TOTAL HIP ARTHROPLASTY ANTERIOR APPROACH (Right Hip)  Patient Location: PACU  Anesthesia Type:Spinal  Level of Consciousness: drowsy  Airway & Oxygen Therapy: Patient Spontanous Breathing and Patient connected to face mask oxygen  Post-op Assessment: Report given to RN and Post -op Vital signs reviewed and stable  Post vital signs: Reviewed and stable  Last Vitals:  Vitals Value Taken Time  BP 94/53 01/02/20 1125  Temp    Pulse 76 01/02/20 1129  Resp 18 01/02/20 1129  SpO2 94 % 01/02/20 1129  Vitals shown include unvalidated device data.  Last Pain:  Vitals:   01/02/20 0644  TempSrc: Oral  PainSc:       Patients Stated Pain Goal: 2 (94/07/68 0881)  Complications: No complications documented.

## 2020-01-02 NOTE — Evaluation (Signed)
Physical Therapy Evaluation Patient Details Name: Greg Morris MRN: [id number] DOB: April 03, 1960 Today's Date: 01/02/2020   History of Present Illness  Patient is 59 y.o. male s/p Rt THA anterior approach on 01/02/20 with PMH significant for stroke (lit sided weakness), PVD, OSA, HTN, COPD, OA.    Clinical Impression  Westlee Devita is a 59 y.o. male POD 0 s/p Rt THA. Patient reports independence with mobility at baseline. Patient is now limited by functional impairments (see PT problem list below) and requires min guard/supervision for transfers and gait with RW. Patient was able to ambulate ~120 feet with RW and supervision and cues for safe walker management. Patient educated on safe sequencing for stair mobility and verbalized safe guarding position for people assisting with mobility. Patient instructed in exercises to facilitate ROM and circulation. Patient will benefit from continued skilled PT interventions to address impairments and progress towards PLOF. Patient has met mobility goals at adequate level for discharge home; will continue to follow if pt continues acute stay to progress towards Mod I goals.     Follow Up Recommendations Follow surgeons recommendation for DC plan and follow-up therapies;Outpatient PT    Equipment Recommendations  None recommended by PT    Recommendations for Other Services       Precautions / Restrictions Precautions Precautions: Fall Restrictions Weight Bearing Restrictions: No Other Position/Activity Restrictions: WBAT      Mobility  Bed Mobility Overal bed mobility: Needs Assistance Bed Mobility: Supine to Sit     Supine to sit: Min assist;HOB elevated     General bed mobility comments: HOB slightly elevated  Transfers Overall transfer level: Needs assistance Equipment used: Rolling walker (2 wheeled) Transfers: Sit to/from Stand Sit to Stand: Min guard;Supervision         General transfer comment: cues for technique with RW,  no assist required for power up.   Ambulation/Gait Ambulation/Gait assistance: Min guard;Supervision Gait Distance (Feet): 120 Feet Assistive device: Rolling walker (2 wheeled) Gait Pattern/deviations: Step-to pattern;Decreased stride length Gait velocity: decr   General Gait Details: cues for safe step pattern and proximity to RW. no overt LOB noted. cues to keep walker on ground.   Stairs Stairs: Yes Stairs assistance: Min guard;Supervision Stair Management: Two rails;Forwards;Step to pattern Number of Stairs: 3 General stair comments: cues for step pattern "up with good, down with bad" and educated on safe guarding position for family.   Wheelchair Mobility    Modified Rankin (Stroke Patients Only)       Balance Overall balance assessment: Needs assistance Sitting-balance support: Feet supported Sitting balance-Leahy Scale: Good     Standing balance support: During functional activity;Bilateral upper extremity supported Standing balance-Leahy Scale: Fair                               Pertinent Vitals/Pain Pain Assessment: 0-10 Pain Score: 4  Pain Location: Rt hip Pain Descriptors / Indicators: Discomfort;Aching Pain Intervention(s): Limited activity within patient's tolerance;Monitored during session;Premedicated before session;Repositioned    Home Living Family/patient expects to be discharged to:: Private residence Living Arrangements: Alone Available Help at Discharge: Family Type of Home: House Home Access: Stairs to enter Entrance Stairs-Rails: None Entrance Stairs-Number of Steps: 1 through [place]: One level Home Equipment: Environmental consultant - 2 wheels;Shower seat;Crutches      Prior Function Level of Independence: Independent with assistive device(s)         Comments: pt has been usisng RW since stroke, Lt  LE weakness remains.     Hand Dominance   Dominant Hand: Right    Extremity/Trunk Assessment   Upper Extremity  Assessment Upper Extremity Assessment: Overall WFL for tasks assessed    Lower Extremity Assessment Lower Extremity Assessment: RLE deficits/detail;LLE deficits/detail RLE Deficits / Details: good quad activation but limited by pain RLE: Unable to fully assess due to pain RLE Sensation: WNL RLE Coordination: WNL LLE Deficits / Details: 4/5 or greater limited by stroke LLE Sensation: decreased light touch LLE Coordination: WNL    Cervical / Trunk Assessment Cervical / Trunk Assessment: Kyphotic  Communication   Communication: No difficulties  Cognition Arousal/Alertness: Awake/alert Behavior During Therapy: WFL for tasks assessed/performed Overall Cognitive Status: Within Functional Limits for tasks assessed                                        General Comments      Exercises Total Joint Exercises Ankle Circles/Pumps: AROM;Both;Supine;10 reps Quad Sets: AROM;Right;Other reps (comment);Seated (3) Short Arc Quad: AROM;Right;Other reps (comment);Seated (2) Heel Slides: AROM;Right;Other reps (comment);Seated (2) Long Arc Quad: AROM;Right;Seated;Other reps (comment) (2)   Assessment/Plan    PT Assessment Patient needs continued PT services  PT Problem List Decreased strength;Decreased range of motion;Decreased activity tolerance;Decreased balance;Decreased mobility;Decreased knowledge of use of DME;Decreased knowledge of precautions;Obesity;Pain       PT Treatment Interventions DME instruction;Gait training;Stair training;Functional mobility training;Therapeutic activities;Therapeutic exercise;Balance training;Patient/family education    PT Goals (Current goals can be found in the Care Plan section)  Acute Rehab PT Goals Patient Stated Goal: get back strength in bil LE's and more independence PT Goal Formulation: With patient Time For Goal Achievement: 01/09/20 Potential to Achieve Goals: Good    Frequency 7X/week   Barriers to discharge         Co-evaluation               AM-PAC PT "6 Clicks" Mobility  Outcome Measure Help needed turning from your back to your side while in a flat bed without using bedrails?: A Little Help needed moving from lying on your back to sitting on the side of a flat bed without using bedrails?: A Little Help needed moving to and from a bed to a chair (including a wheelchair)?: A Little Help needed standing up from a chair using your arms (e.g., wheelchair or bedside chair)?: A Little Help needed to walk in hospital room?: A Little Help needed climbing 3-5 steps with a railing? : A Little 6 Click Score: 18    End of Session Equipment Utilized During Treatment: Gait belt Activity Tolerance: Patient tolerated treatment well Patient left: in chair;with call bell/phone within reach Nurse Communication: Mobility status PT Visit Diagnosis: Difficulty in walking, not elsewhere classified (R26.2);Muscle weakness (generalized) (M62.81)    Time: 8563-1497 PT Time Calculation (min) (ACUTE ONLY): 36 min   Charges:   PT Evaluation $PT Eval Low Complexity: 1 Low PT Treatments $Gait Training: 8-22 mins       Verner Mould, DPT Acute Rehabilitation Services  Office [phone] Pager [phone]  01/02/2020 4:01 PM

## 2020-01-02 NOTE — Anesthesia Procedure Notes (Signed)
Spinal  Patient location during procedure: OR Start time: 01/02/2020 9:35 AM End time: 01/02/2020 9:39 AM Staffing Performed: resident/CRNA  Anesthesiologist: Merlinda Frederick, MD Resident/CRNA: Montel Clock, CRNA Preanesthetic Checklist Completed: patient identified, IV checked, risks and benefits discussed, surgical consent, monitors and equipment checked, pre-op evaluation and timeout performed Spinal Block Patient position: sitting Prep: DuraPrep Patient monitoring: heart rate, cardiac monitor, continuous pulse ox and blood pressure Approach: midline Location: L3-4 Injection technique: single-shot Needle Needle type: Pencan  Needle gauge: 24 G Needle length: 10 cm Needle insertion depth: 8 cm Assessment Sensory level: T6

## 2020-01-02 NOTE — Anesthesia Postprocedure Evaluation (Signed)
Anesthesia Post Note  Patient: Greg Morris  Procedure(s) Performed: TOTAL HIP ARTHROPLASTY ANTERIOR APPROACH (Right Hip)     Patient location during evaluation: PACU Anesthesia Type: Spinal Level of consciousness: awake and alert Pain management: pain level controlled Vital Signs Assessment: post-procedure vital signs reviewed and stable Respiratory status: spontaneous breathing and respiratory function stable Cardiovascular status: blood pressure returned to baseline and stable Postop Assessment: spinal receding Anesthetic complications: no   No complications documented.  Last Vitals:  Vitals:   01/02/20 1400 01/02/20 1530  BP: (!) 145/77 (!) 166/92  Pulse: 74 76  Resp: 20 20  Temp:  (!) 36.4 C  SpO2: 95% 96%    Last Pain:  Vitals:   01/02/20 1530  TempSrc:   PainSc: 5                  Candra R Merrill Deanda

## 2020-01-03 ENCOUNTER — Encounter (HOSPITAL_COMMUNITY): Payer: Self-pay | Admitting: Orthopedic Surgery

## 2021-08-28 IMAGING — DX DG PORTABLE PELVIS
1 series · 1 of 1 positions shown · non-contrast
Comparison: Portable exam 2212 hours compared to earlier
intraoperative images of same date

CLINICAL DATA: Post RIGHT anterior hip replacement

EXAM:
PORTABLE PELVIS 1-2 VIEWS

[pelvis ap]
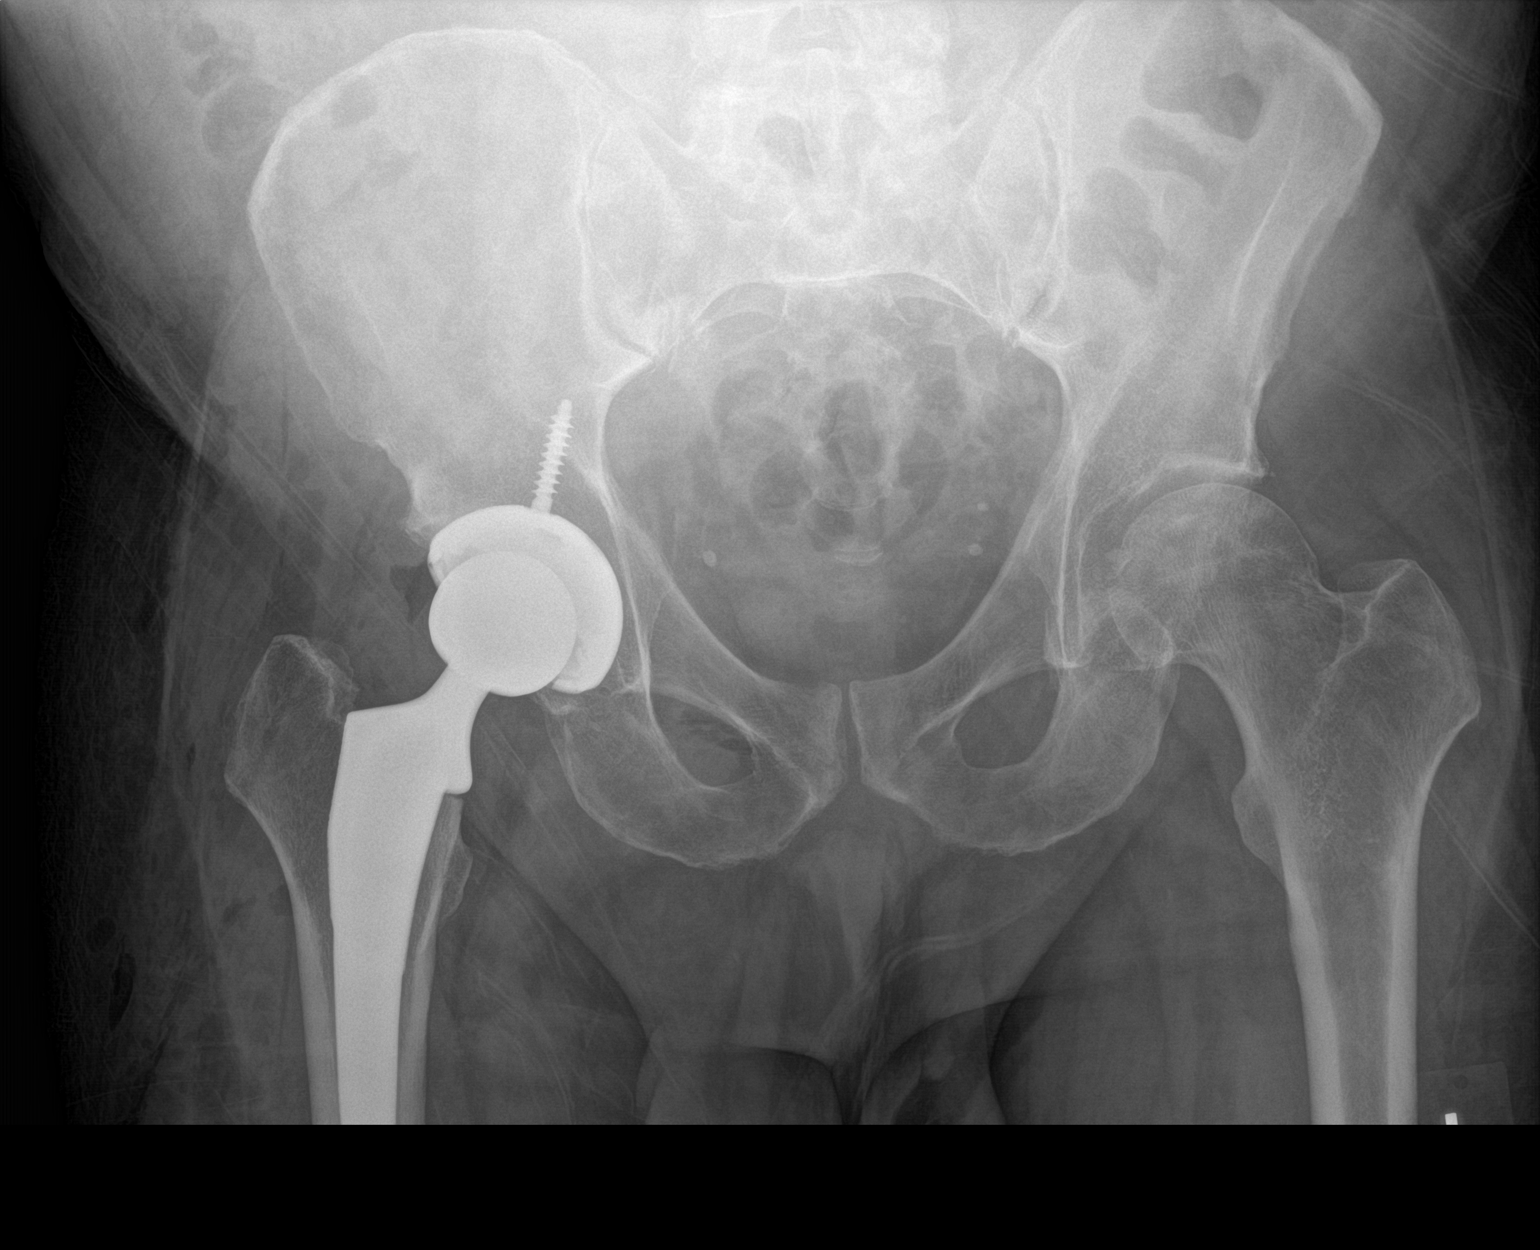

[1 of 1 positions shown; findings below may reference images not displayed]

FINDINGS: Osseous demineralization.

RIGHT hip prosthesis in expected position.

No fracture, dislocation, or bone destruction identified on single
AP view.

LEFT hip and SI joint spaces preserved.

Small pelvic phleboliths.
IMPRESSION: RIGHT hip prosthesis without acute complication.

## 2021-08-28 IMAGING — RF DG HIP (WITH PELVIS) OPERATIVE*R*
1 series · 2 of 2 positions shown · non-contrast
Comparison: None.

CLINICAL DATA: Right hip replacement.

EXAM:
OPERATIVE right HIP (WITH PELVIS IF PERFORMED) 2 VIEWS
TECHNIQUE: Fluoroscopic spot image(s) were submitted for interpretation
post-operatively.
Radiation exposure index: 1.8431 mGy.

[Series 1: unknown protocol · 0.20mm/px · 2 of 2 slices shown]
[im 1/2]
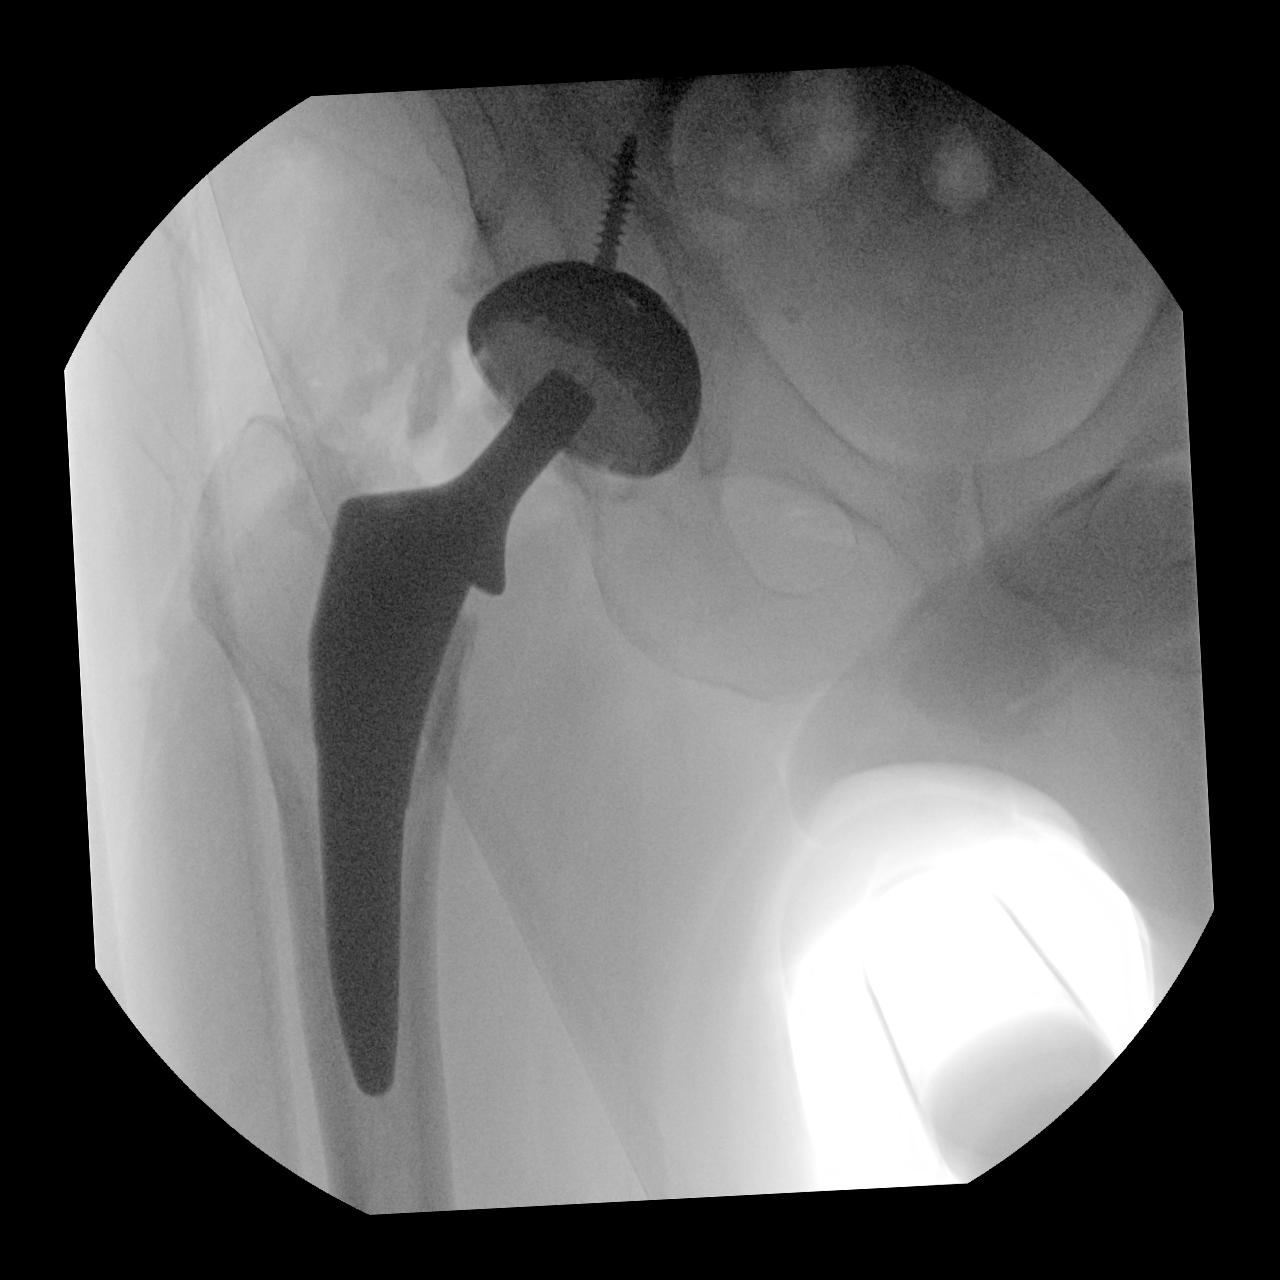
[im 2/2]
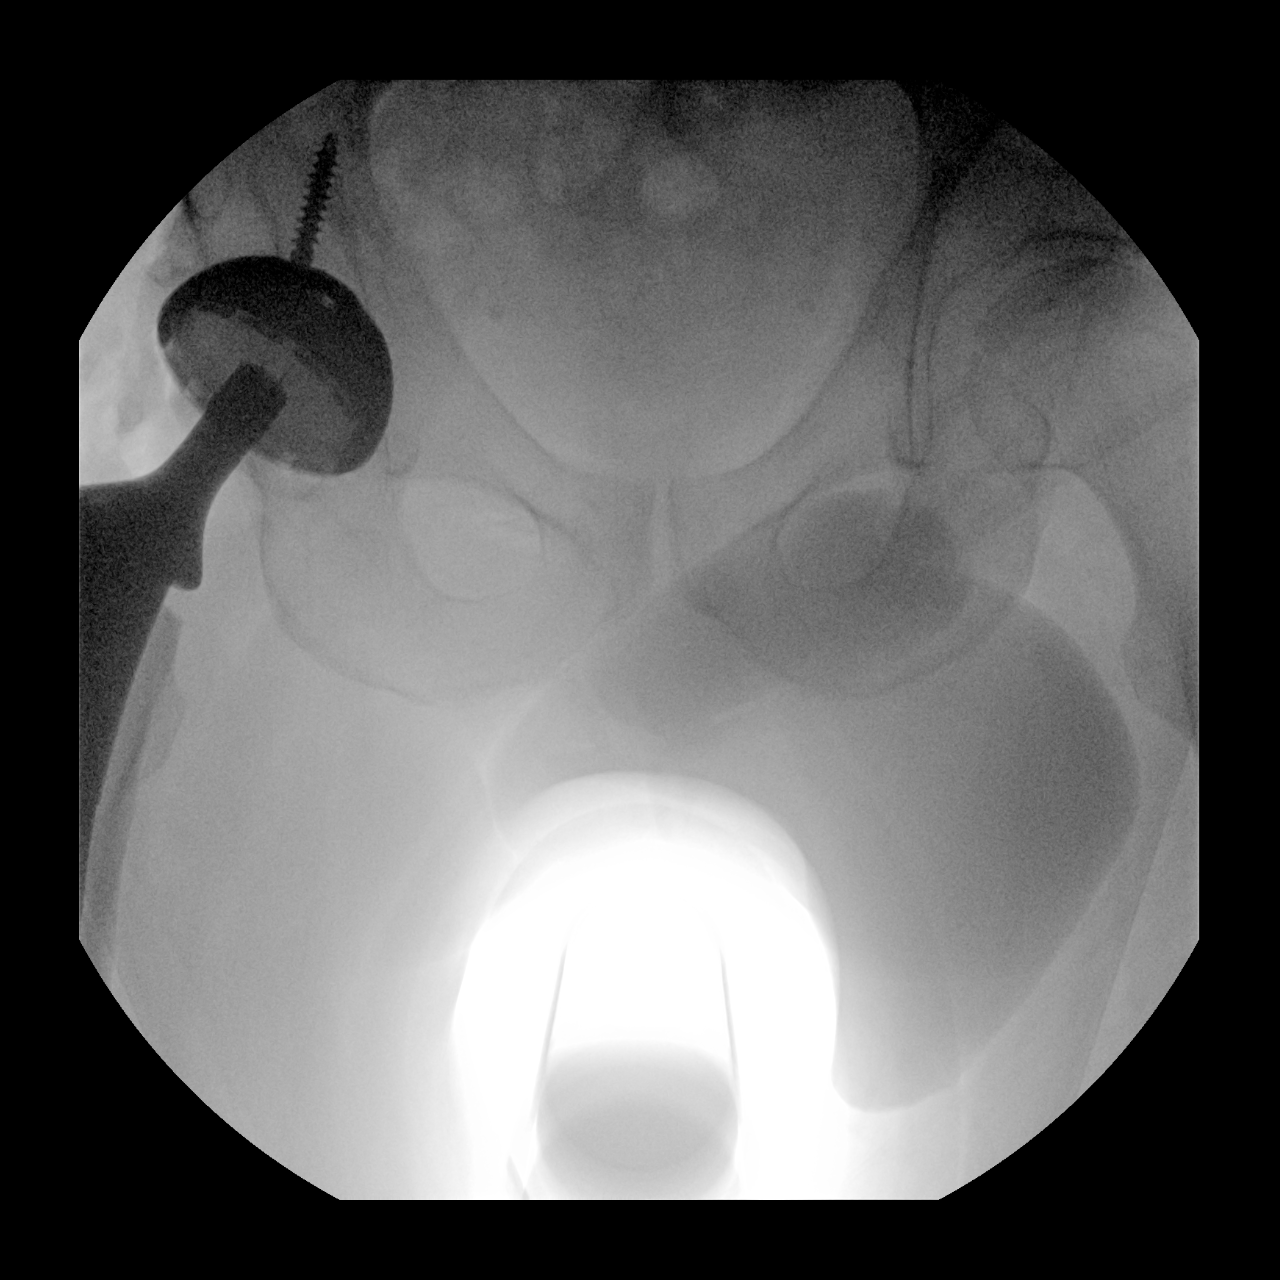

[2 of 2 positions shown; findings below may reference images not displayed]

FINDINGS: Two intraoperative fluoroscopic images of the right hip demonstrate
the right acetabular and femoral components to be well situated.
IMPRESSION: Status post right total hip arthroplasty.

## 2021-08-28 IMAGING — RF DG C-ARM 1-60 MIN-NO REPORT
1 series · 2 of 2 positions shown · non-contrast
Comparison: None.

CLINICAL DATA: Right hip replacement.

EXAM:
OPERATIVE right HIP (WITH PELVIS IF PERFORMED) 2 VIEWS
TECHNIQUE: Fluoroscopic spot image(s) were submitted for interpretation
post-operatively.
Radiation exposure index: 1.8431 mGy.

[Series 1: unknown protocol · 0.20mm/px · 2 of 2 slices shown]
[im 1/2]
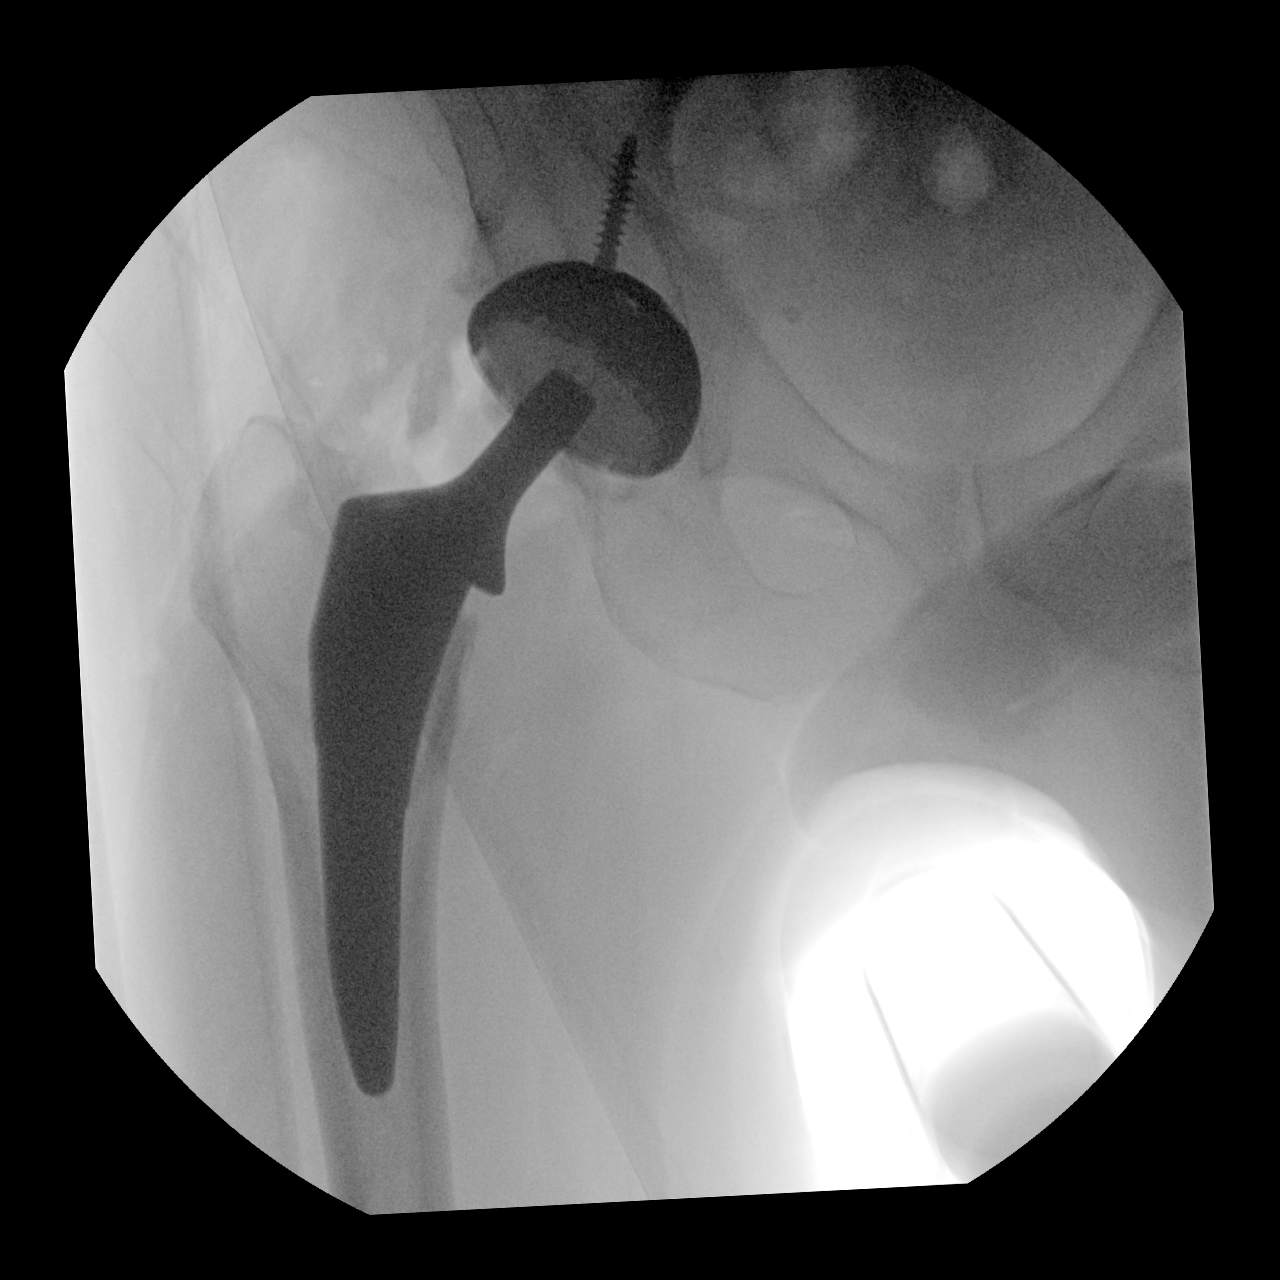
[im 2/2]
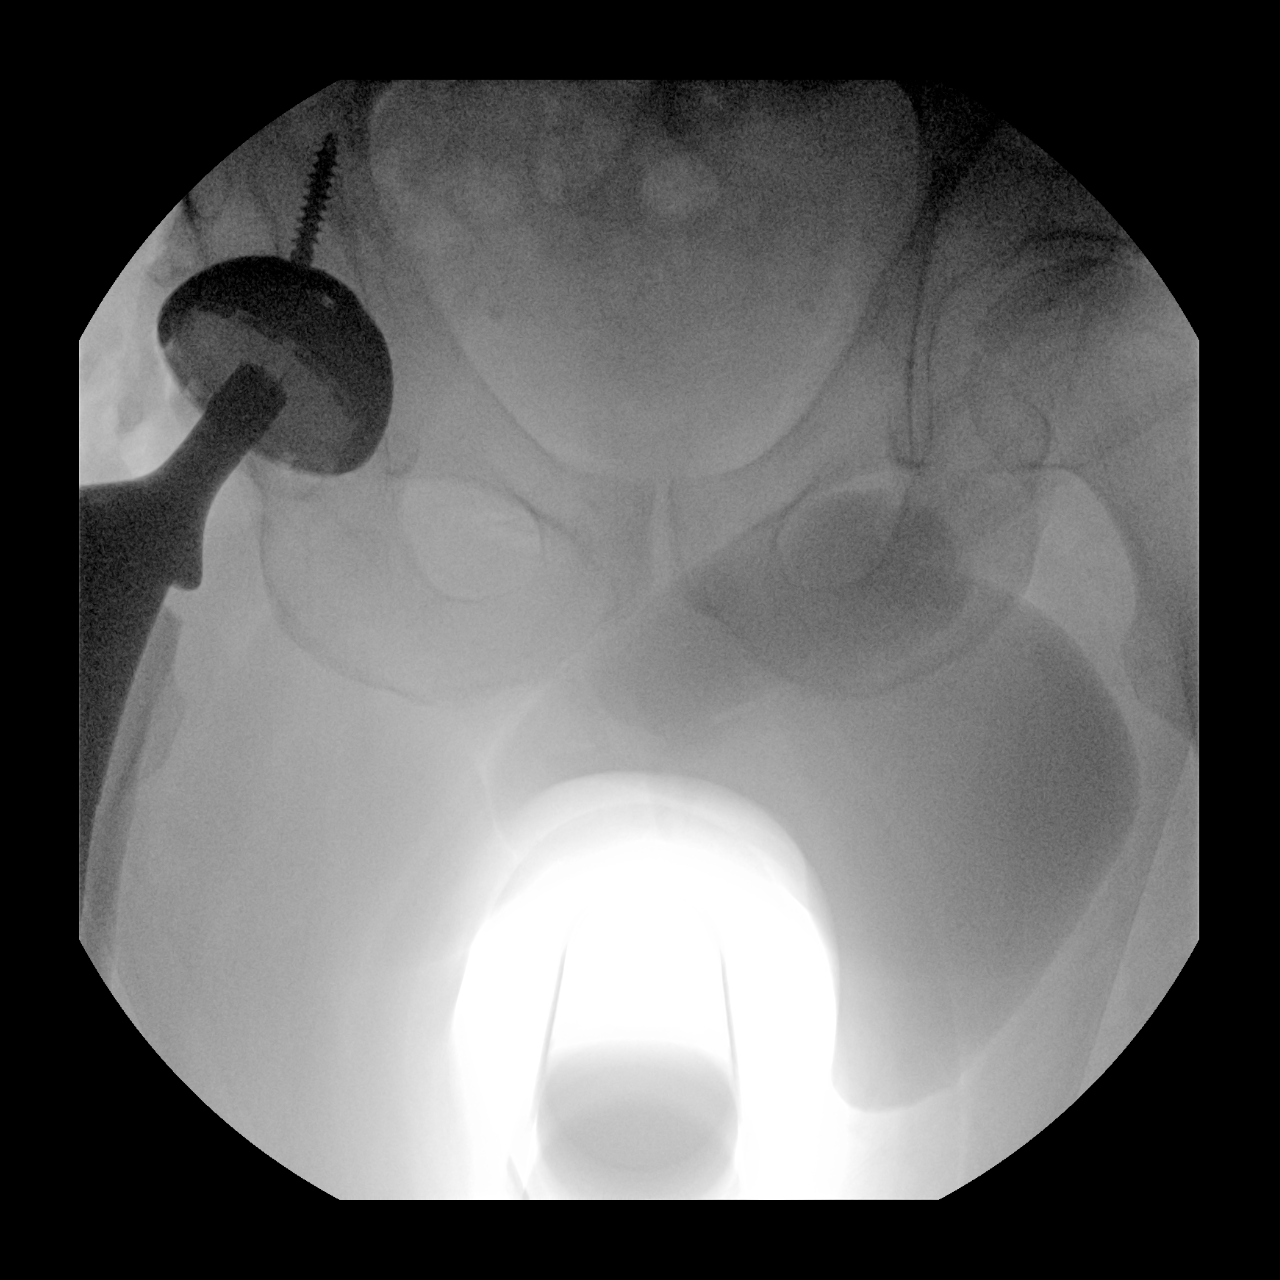

[2 of 2 positions shown; findings below may reference images not displayed]

FINDINGS: Two intraoperative fluoroscopic images of the right hip demonstrate
the right acetabular and femoral components to be well situated.
IMPRESSION: Status post right total hip arthroplasty.
